# Patient Record
Sex: Female | Born: 1970 | Hispanic: No | Marital: Married | Smoking: Never smoker
Health system: Southern US, Community
[De-identification: ages and names within clinical notes are randomized; demographics above are authoritative.]

## PROBLEM LIST (undated history)

## (undated) DIAGNOSIS — D649 Anemia, unspecified: Secondary | ICD-10-CM

## (undated) HISTORY — DX: Anemia, unspecified: D64.9

---

## 1993-11-14 HISTORY — PX: TUBAL LIGATION: SHX77

## 2011-03-15 ENCOUNTER — Emergency Department: Payer: Self-pay | Admitting: Emergency Medicine

## 2012-08-06 IMAGING — US ABDOMEN ULTRASOUND
1 series · 14 of 25 positions shown · non-contrast
Comparison: none

REASON FOR EXAM: abdominal pain x 2 weeks with elevated liver panel
Flex 4
COMMENTS:

[Series 1: abdomen ultrasound · 0.31mm/px · 14 of 47 slices shown]
[im 1/47]
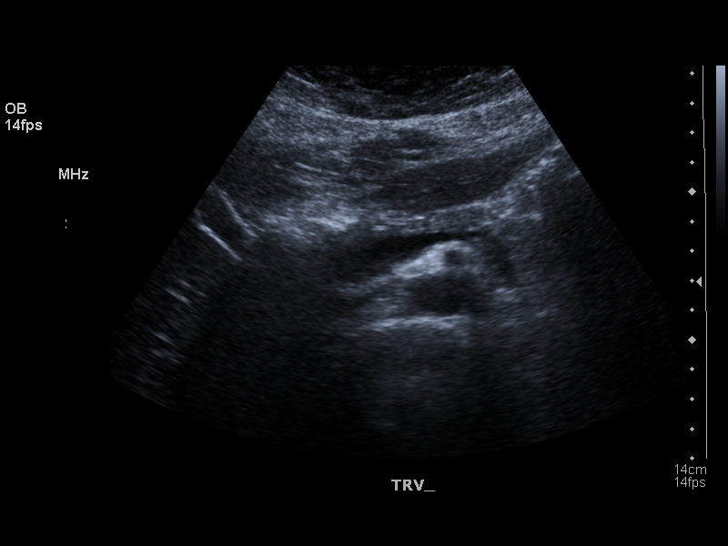
[im 4/47]
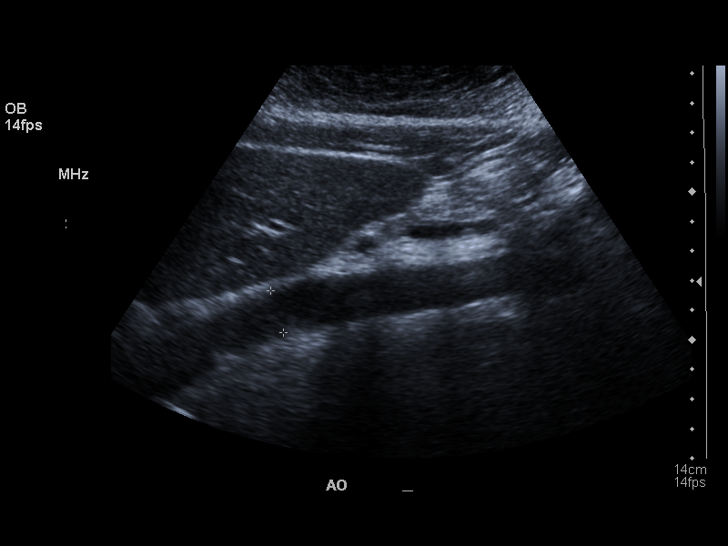
[im 8/47]
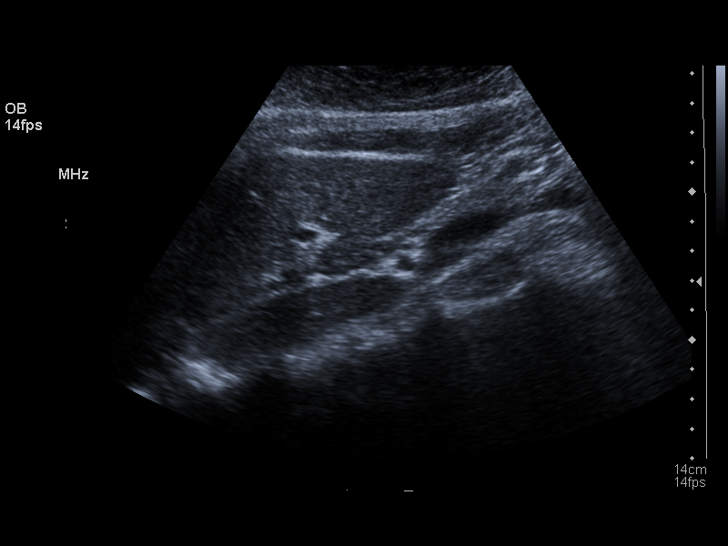
[im 12/47]
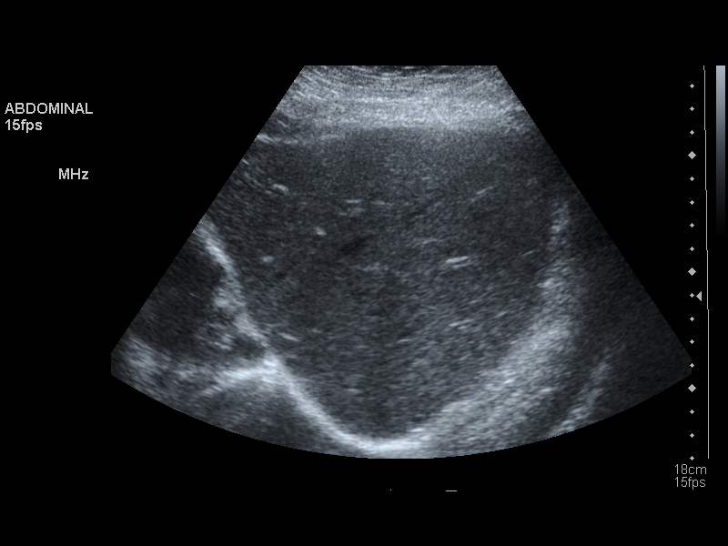
[im 16/47]
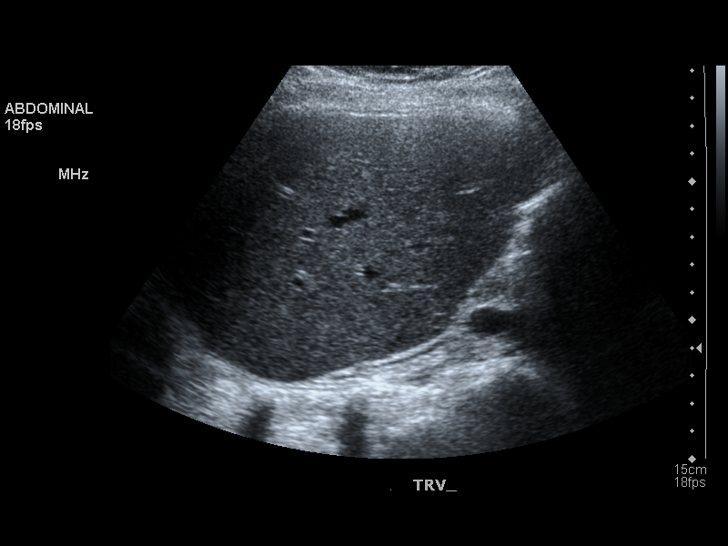
[im 18/47]
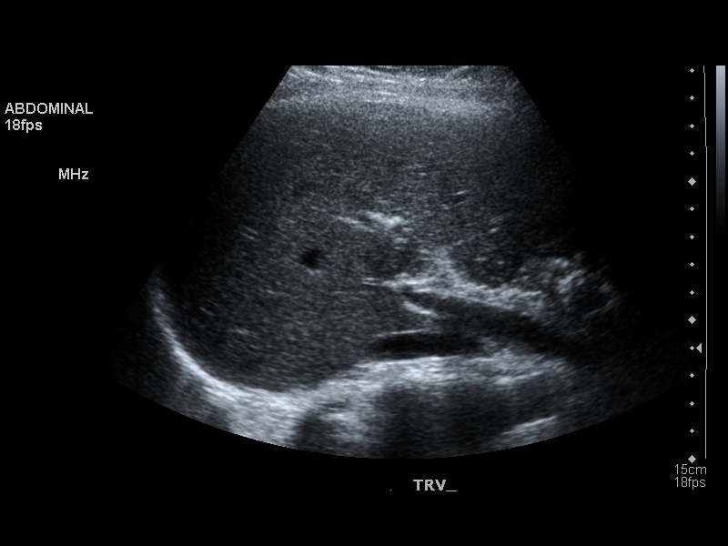
[im 22/47]
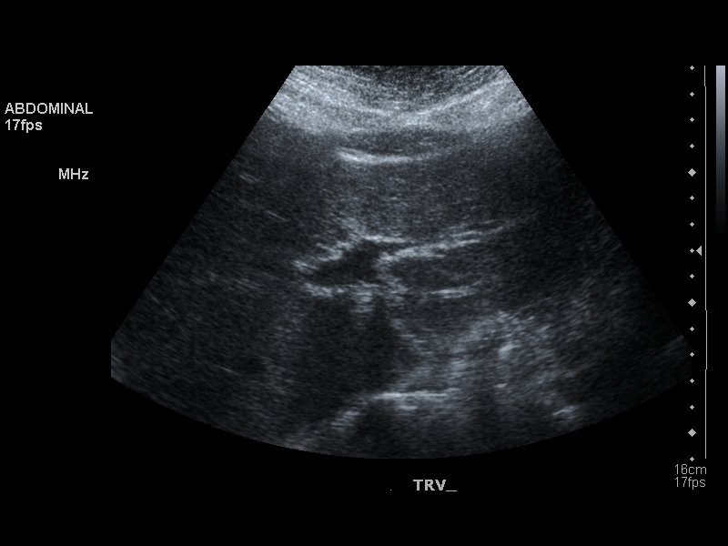
[im 25/47]
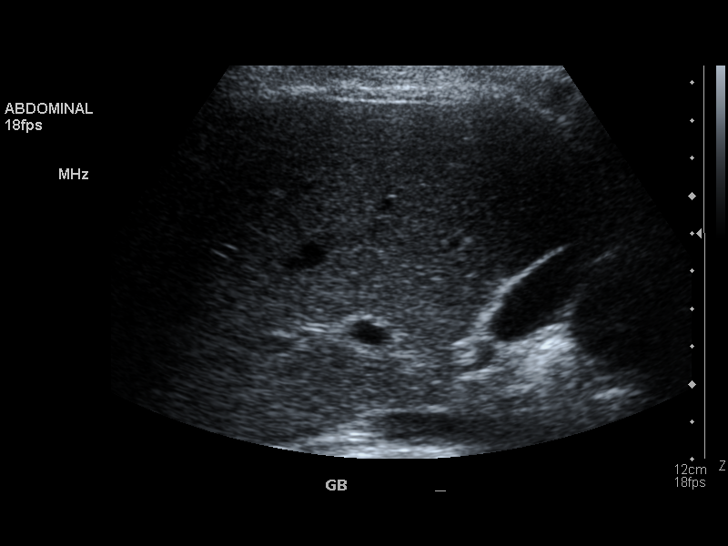
[im 29/47]
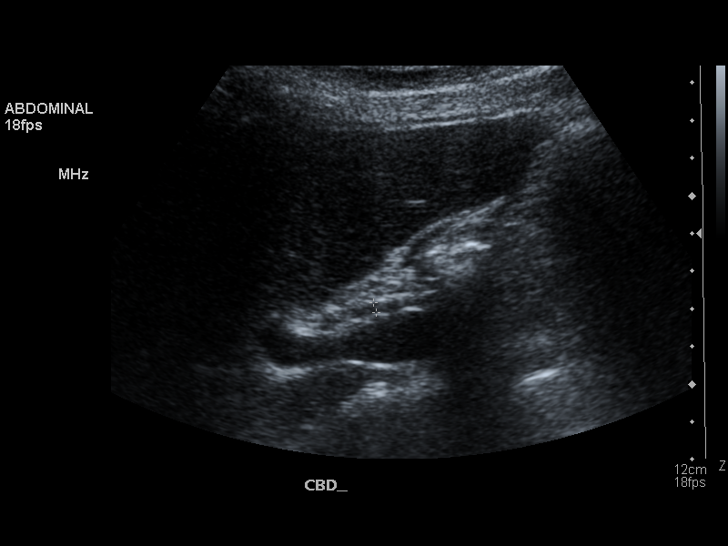
[im 31/47]
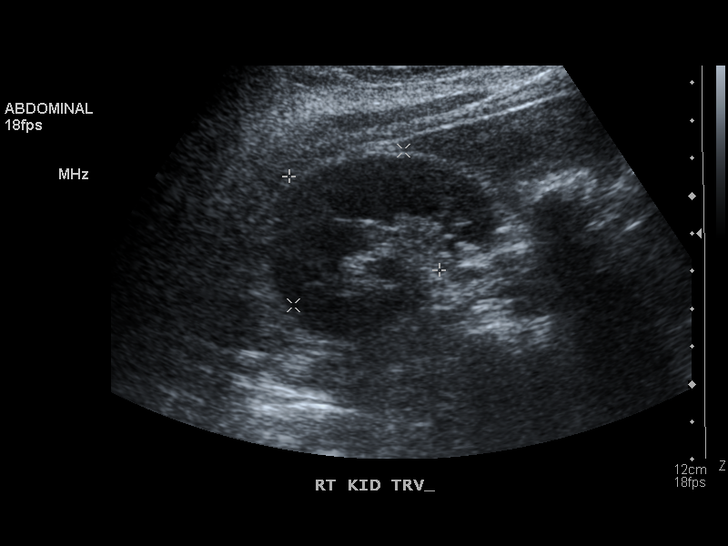
[im 35/47]
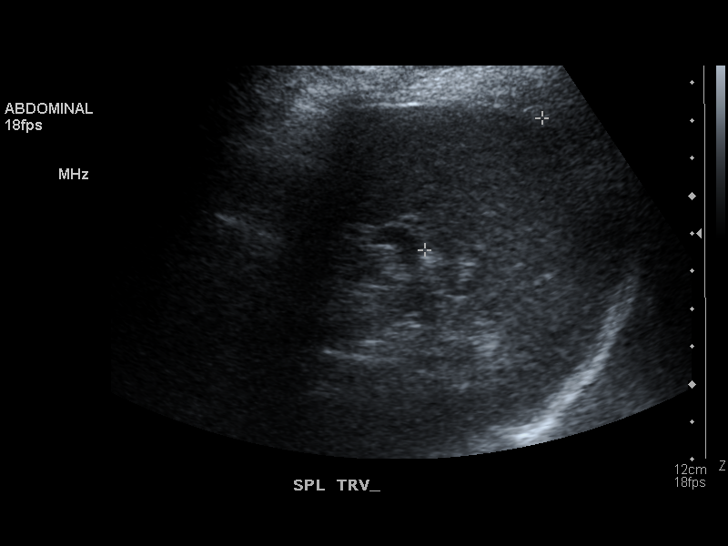
[im 39/47]
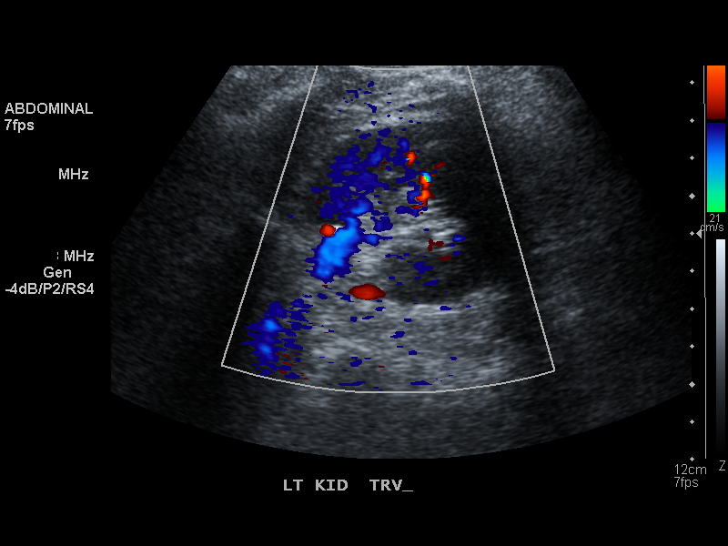
[im 43/47]
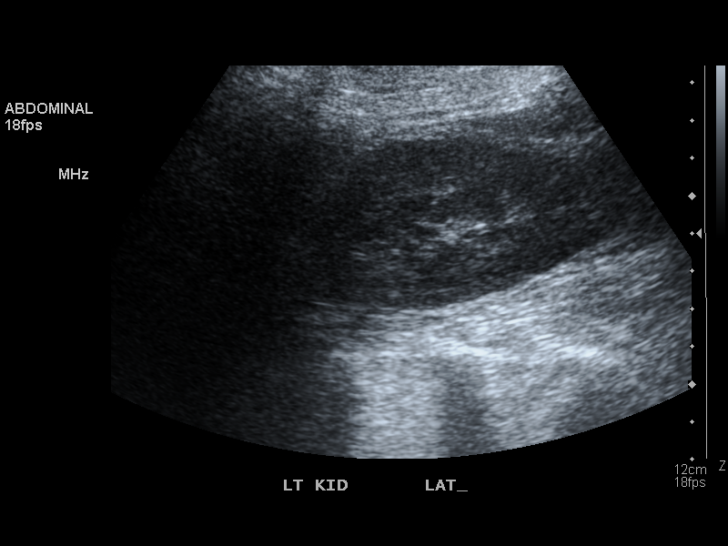
[im 47/47]
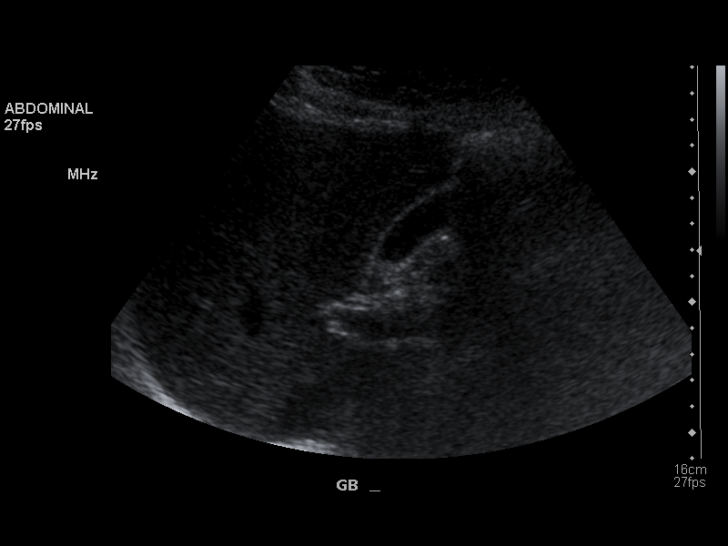

[14 of 25 positions shown; findings below may reference images not displayed]

PROCEDURE:     US  - US ABDOMEN GENERAL SURVEY  - March 15, 2011  [DATE]

RESULT:     Comparison: None.

Technique and Findings:
Multiple grayscale and color Doppler images were obtained of the abdomen.

The visualized liver pancreas, and spleen are unremarkable. Gallbladder is
normal. Sonographic Murphy's sign negative. Common bile duct measures 3 mm.
Images of the kidneys show no hydronephrosis.
IMPRESSION: No acute findings. Normal gallbladder.

## 2013-01-10 ENCOUNTER — Emergency Department: Payer: Self-pay | Admitting: Internal Medicine

## 2015-09-30 ENCOUNTER — Encounter: Payer: Self-pay | Admitting: Family Medicine

## 2015-09-30 ENCOUNTER — Ambulatory Visit (INDEPENDENT_AMBULATORY_CARE_PROVIDER_SITE_OTHER): Payer: BLUE CROSS/BLUE SHIELD | Admitting: Family Medicine

## 2015-09-30 ENCOUNTER — Ambulatory Visit
Admission: RE | Admit: 2015-09-30 | Discharge: 2015-09-30 | Disposition: A | Discharge status: Home / Self Care | Payer: BLUE CROSS/BLUE SHIELD | Source: Ambulatory Visit | Attending: Family Medicine | Admitting: Family Medicine

## 2015-09-30 VITALS — BP 128/76 | HR 84 | Temp 98.2°F | Resp 17 | Ht 63.0 in | Wt 185.9 lb

## 2015-09-30 DIAGNOSIS — R002 Palpitations: Secondary | ICD-10-CM | POA: Diagnosis not present

## 2015-09-30 DIAGNOSIS — R0600 Dyspnea, unspecified: Secondary | ICD-10-CM | POA: Insufficient documentation

## 2015-09-30 NOTE — Progress Notes (Signed)
Name: Karen Garza   MRN: [id number]    DOB: 1970-11-19   Date:09/30/2015       Progress Note  Subjective  Chief Complaint  Chief Complaint  Patient presents with  . Establish Care    NP    HPI  Pt. Is here to establish care. No previous PCP She presents for evaluation of a 1-2 year history of a faster heart rate, coupled with intermittent episodes of 'hard to breathe.' She describes heart beating faster than usual and she has to take a deep breath at that time. This happens even when she is sitting down and when she exerts herself. Working out or exercising makes her short of breath and she gets wheezing as well. She believes her heart was in irregular rhythm during a physical at her job recently.  She works as a Location manager.   Past Medical History  Diagnosis Date  . Anemia     Past Surgical History  Procedure Laterality Date  . Tubal ligation  1995    Family History  Problem Relation Age of Onset  . Diabetes Mother   . Thyroid disease Mother   . Hypertension Father   . Diabetes Father     Social History   Social History  . Marital Status: Married    Spouse Name: N/A  . Number of Children: N/A  . Years of Education: N/A   Occupational History  . Not on file.   Social History Main Topics  . Smoking status: Never Smoker   . Smokeless tobacco: Never Used  . Alcohol Use: No  . Drug Use: No  . Sexual Activity: No   Other Topics Concern  . Not on file   Social History Narrative  . No narrative on file    No current outpatient prescriptions on file.  Allergies  Allergen Reactions  . Codeine Palpitations    Review of Systems  Constitutional: Positive for chills (sometimes has chills). Negative for fever, weight loss and malaise/fatigue.  Respiratory: Positive for cough, shortness of breath and wheezing. Negative for sputum production.   Cardiovascular: Positive for chest pain and palpitations. Negative for leg swelling.  Gastrointestinal:  Positive for heartburn (occasional heartburn), nausea and vomiting.  Genitourinary: Negative for dysuria.  Neurological: Negative for headaches.  Psychiatric/Behavioral: Negative for depression. The patient is not nervous/anxious and does not have insomnia.     Objective  Filed Vitals:   09/30/15 0950  BP: 128/76  Pulse: 84  Temp: 98.2 F (36.8 C)  TempSrc: Oral  Resp: 17  Height:  (1.6 m)  Weight: 185 lb 14.4 oz (84.324 kg)  SpO2: 98%    Physical Exam  Constitutional: She is oriented to person, place, and time and well-developed, well-nourished, and in no distress.  HENT:  Head: Normocephalic and atraumatic.  Eyes: Conjunctivae are normal. Pupils are equal, round, and reactive to light.  Neck: Normal range of motion. Neck supple.  Cardiovascular: Normal rate, regular rhythm and normal heart sounds.   No murmur heard. Pulmonary/Chest: Effort normal and breath sounds normal. She has no wheezes. She has no rales.  Abdominal: Soft. Bowel sounds are normal. There is no tenderness.  Neurological: She is alert and oriented to person, place, and time.  Skin: Skin is warm and dry.  Psychiatric: Memory, affect and judgment normal.  Nursing note and vitals reviewed.   Assessment & Plan  1. Intermittent palpitations EKG reviewed which is unremarkable. We will obtain laboratory evaluation and chest x-ray for patient's complaints  of palpitations and dyspnea. Referral to cardiology for further workup. - EKG 12-Lead - DG Chest 2 View; Future - CBC with Differential - Comprehensive Metabolic Panel (CMET) - TSH - Magnesium - Ambulatory referral to Cardiology    [facility]yed Asad A. Faylene KurtzShah [facility] 09/30/2015 10:09 AM

## 2015-10-01 LAB — CBC WITH DIFFERENTIAL/PLATELET
BASOS ABS: 0 10*3/uL (ref 0.0–0.2)
Basos: 1 %
EOS (ABSOLUTE): 0.1 10*3/uL (ref 0.0–0.4)
Eos: 2 %
HEMATOCRIT: 33.2 % — AB (ref 34.0–46.6)
HEMOGLOBIN: 10.3 g/dL — AB (ref 11.1–15.9)
Immature Grans (Abs): 0 10*3/uL (ref 0.0–0.1)
Immature Granulocytes: 0 %
Lymphocytes Absolute: 2.2 10*3/uL (ref 0.7–3.1)
Lymphs: 38 %
MCH: 21.6 pg — ABNORMAL LOW (ref 26.6–33.0)
MCHC: 31 g/dL — AB (ref 31.5–35.7)
MCV: 70 fL — ABNORMAL LOW (ref 79–97)
MONOS ABS: 0.4 10*3/uL (ref 0.1–0.9)
Monocytes: 7 %
Neutrophils Absolute: 3.1 10*3/uL (ref 1.4–7.0)
Neutrophils: 52 %
Platelets: 378 10*3/uL (ref 150–379)
RBC: 4.77 x10E6/uL (ref 3.77–5.28)
RDW: 17.8 % — AB (ref 12.3–15.4)
WBC: 5.8 10*3/uL (ref 3.4–10.8)

## 2015-10-01 LAB — COMPREHENSIVE METABOLIC PANEL
ALBUMIN: 4.1 g/dL (ref 3.5–5.5)
ALT: 13 IU/L (ref 0–32)
AST: 14 IU/L (ref 0–40)
Albumin/Globulin Ratio: 1.4 (ref 1.1–2.5)
Alkaline Phosphatase: 69 IU/L (ref 39–117)
BILIRUBIN TOTAL: 0.4 mg/dL (ref 0.0–1.2)
BUN / CREAT RATIO: 17 (ref 9–23)
BUN: 11 mg/dL (ref 6–24)
CHLORIDE: 104 mmol/L (ref 97–106)
CO2: 25 mmol/L (ref 18–29)
Calcium: 9.1 mg/dL (ref 8.7–10.2)
Creatinine, Ser: 0.64 mg/dL (ref 0.57–1.00)
GFR calc Af Amer: 126 mL/min/{1.73_m2} (ref >59)
GFR calc non Af Amer: 109 mL/min/{1.73_m2} (ref >59)
GLOBULIN, TOTAL: 2.9 g/dL (ref 1.5–4.5)
GLUCOSE: 92 mg/dL (ref 65–99)
POTASSIUM: 4.7 mmol/L (ref 3.5–5.2)
SODIUM: 143 mmol/L (ref 136–144)
Total Protein: 7 g/dL (ref 6.0–8.5)

## 2015-10-01 LAB — MAGNESIUM: MAGNESIUM: 2 mg/dL (ref 1.6–2.3)

## 2015-10-01 LAB — TSH: TSH: 2.19 u[IU]/mL (ref 0.450–4.500)

## 2015-10-12 ENCOUNTER — Ambulatory Visit: Payer: BLUE CROSS/BLUE SHIELD | Admitting: Family Medicine

## 2015-10-15 ENCOUNTER — Ambulatory Visit: Payer: BLUE CROSS/BLUE SHIELD | Admitting: Family Medicine

## 2015-12-01 ENCOUNTER — Encounter: Payer: Self-pay | Admitting: *Deleted

## 2015-12-01 ENCOUNTER — Ambulatory Visit: Payer: BLUE CROSS/BLUE SHIELD | Admitting: Cardiovascular Disease

## 2016-04-19 ENCOUNTER — Encounter: Payer: Self-pay | Admitting: Family Medicine

## 2016-04-19 ENCOUNTER — Ambulatory Visit (INDEPENDENT_AMBULATORY_CARE_PROVIDER_SITE_OTHER): Payer: BLUE CROSS/BLUE SHIELD | Admitting: Family Medicine

## 2016-04-19 VITALS — BP 110/70 | HR 90 | Temp 98.8°F | Resp 16 | Ht 63.0 in | Wt 182.5 lb

## 2016-04-19 DIAGNOSIS — R42 Dizziness and giddiness: Secondary | ICD-10-CM

## 2016-04-19 DIAGNOSIS — Z0000 Encounter for general adult medical examination without abnormal findings: Secondary | ICD-10-CM | POA: Insufficient documentation

## 2016-04-19 MED ORDER — MECLIZINE HCL 25 MG PO TABS: 25.0000 mg | ORAL_TABLET | Freq: Two times a day (BID) | ORAL | Status: DC | PRN

## 2016-04-19 NOTE — Progress Notes (Signed)
Name: Karen Garza   MRN: [id number]    DOB: 01-08-1971   Date:04/19/2016       Progress Note  Subjective  Chief Complaint  Chief Complaint  Patient presents with  . Annual Exam    HPI  Pt. Is here for a Complete Physical Exam with Gynecological exams.  She does not wish to have a Gynecological exam and does not have a gynecologist.   Past Medical History  Diagnosis Date  . Anemia     Past Surgical History  Procedure Laterality Date  . Tubal ligation  1995    Family History  Problem Relation Age of Onset  . Diabetes Mother   . Thyroid disease Mother   . Hypertension Father   . Diabetes Father     Social History   Social History  . Marital Status: Married    Spouse Name: N/A  . Number of Children: N/A  . Years of Education: N/A   Occupational History  . Not on file.   Social History Main Topics  . Smoking status: Never Smoker   . Smokeless tobacco: Never Used  . Alcohol Use: No  . Drug Use: No  . Sexual Activity: No   Other Topics Concern  . Not on file   Social History Narrative    No current outpatient prescriptions on file.  Allergies  Allergen Reactions  . Codeine Palpitations     Review of Systems  Constitutional: Positive for chills (chills every day when she comes home from work.) and malaise/fatigue (feels tired every day because of work.). Negative for fever.  HENT: Negative for congestion and sore throat.   Eyes: Negative for blurred vision.  Respiratory: Positive for shortness of breath (occasional shortness of breath). Negative for cough.   Cardiovascular: Negative for chest pain.  Gastrointestinal: Negative for heartburn, nausea (1 episode of nausea and vomiting with Vertigo last week which was self limiting.), vomiting, abdominal pain, diarrhea, constipation and blood in stool.  Genitourinary: Negative for dysuria, frequency and hematuria.  Musculoskeletal: Negative for myalgias, back pain and joint pain.  Skin: Negative for  rash.  Neurological: Positive for dizziness (1 episode of vertigo last week, dizziness, felt room was spinning, unsteady on her feet, resolved spontaneously.). Negative for tremors, seizures and headaches.  Psychiatric/Behavioral: Negative for depression. The patient is not nervous/anxious.     Objective  Filed Vitals:   04/19/16 1122  BP: 110/70  Pulse: 90  Temp: 98.8 F (37.1 C)  TempSrc: Oral  Resp: 16  Height: 5\' 3"  (1.6 m)  Weight: 182 lb 8 oz (82.781 kg)  SpO2: 98%    Physical Exam  Constitutional: She is oriented to person, place, and time and well-developed, well-nourished, and in no distress.  HENT:  Head: Normocephalic and atraumatic.  Eyes: Pupils are equal, round, and reactive to light.  Cardiovascular: Normal rate, regular rhythm and normal heart sounds.   Pulmonary/Chest: Effort normal and breath sounds normal.  Abdominal: Soft. Bowel sounds are normal.  Musculoskeletal: Normal range of motion.  Neurological: She is alert and oriented to person, place, and time. She has normal strength, normal reflexes and intact cranial nerves.  Psychiatric: Mood, memory, affect and judgment normal.  Nursing note and vitals reviewed.     Assessment & Plan  1. Annual physical exam Age appropriate laboratory screenings completed. - CBC with Differential - Lipid Profile - Vitamin D (25 hydroxy)  2. Vertigo Patient's episode lasting appears to be related to vertigo. We will recommend trying Meclizine or  relief. Prescription provided - meclizine (ANTIVERT) 25 MG tablet; Take 1 tablet (25 mg total) by mouth 2 (two) times daily as needed for dizziness.  Dispense: 60 tablet; Refill: 0   Ripley Lovecchio Asad A. Faylene [facility] 04/19/2016 11:53 AM

## 2017-02-21 IMAGING — CR DG CHEST 2V
1 series · 2 of 2 positions shown · non-contrast
Comparison: None.

CLINICAL DATA: Intermittent palpitations, dyspnea, wheezing

EXAM:
CHEST  2 VIEW

[Series 1: dg chest 2 view · 0.14mm/px · 2 of 2 slices shown]
[im 1/2]
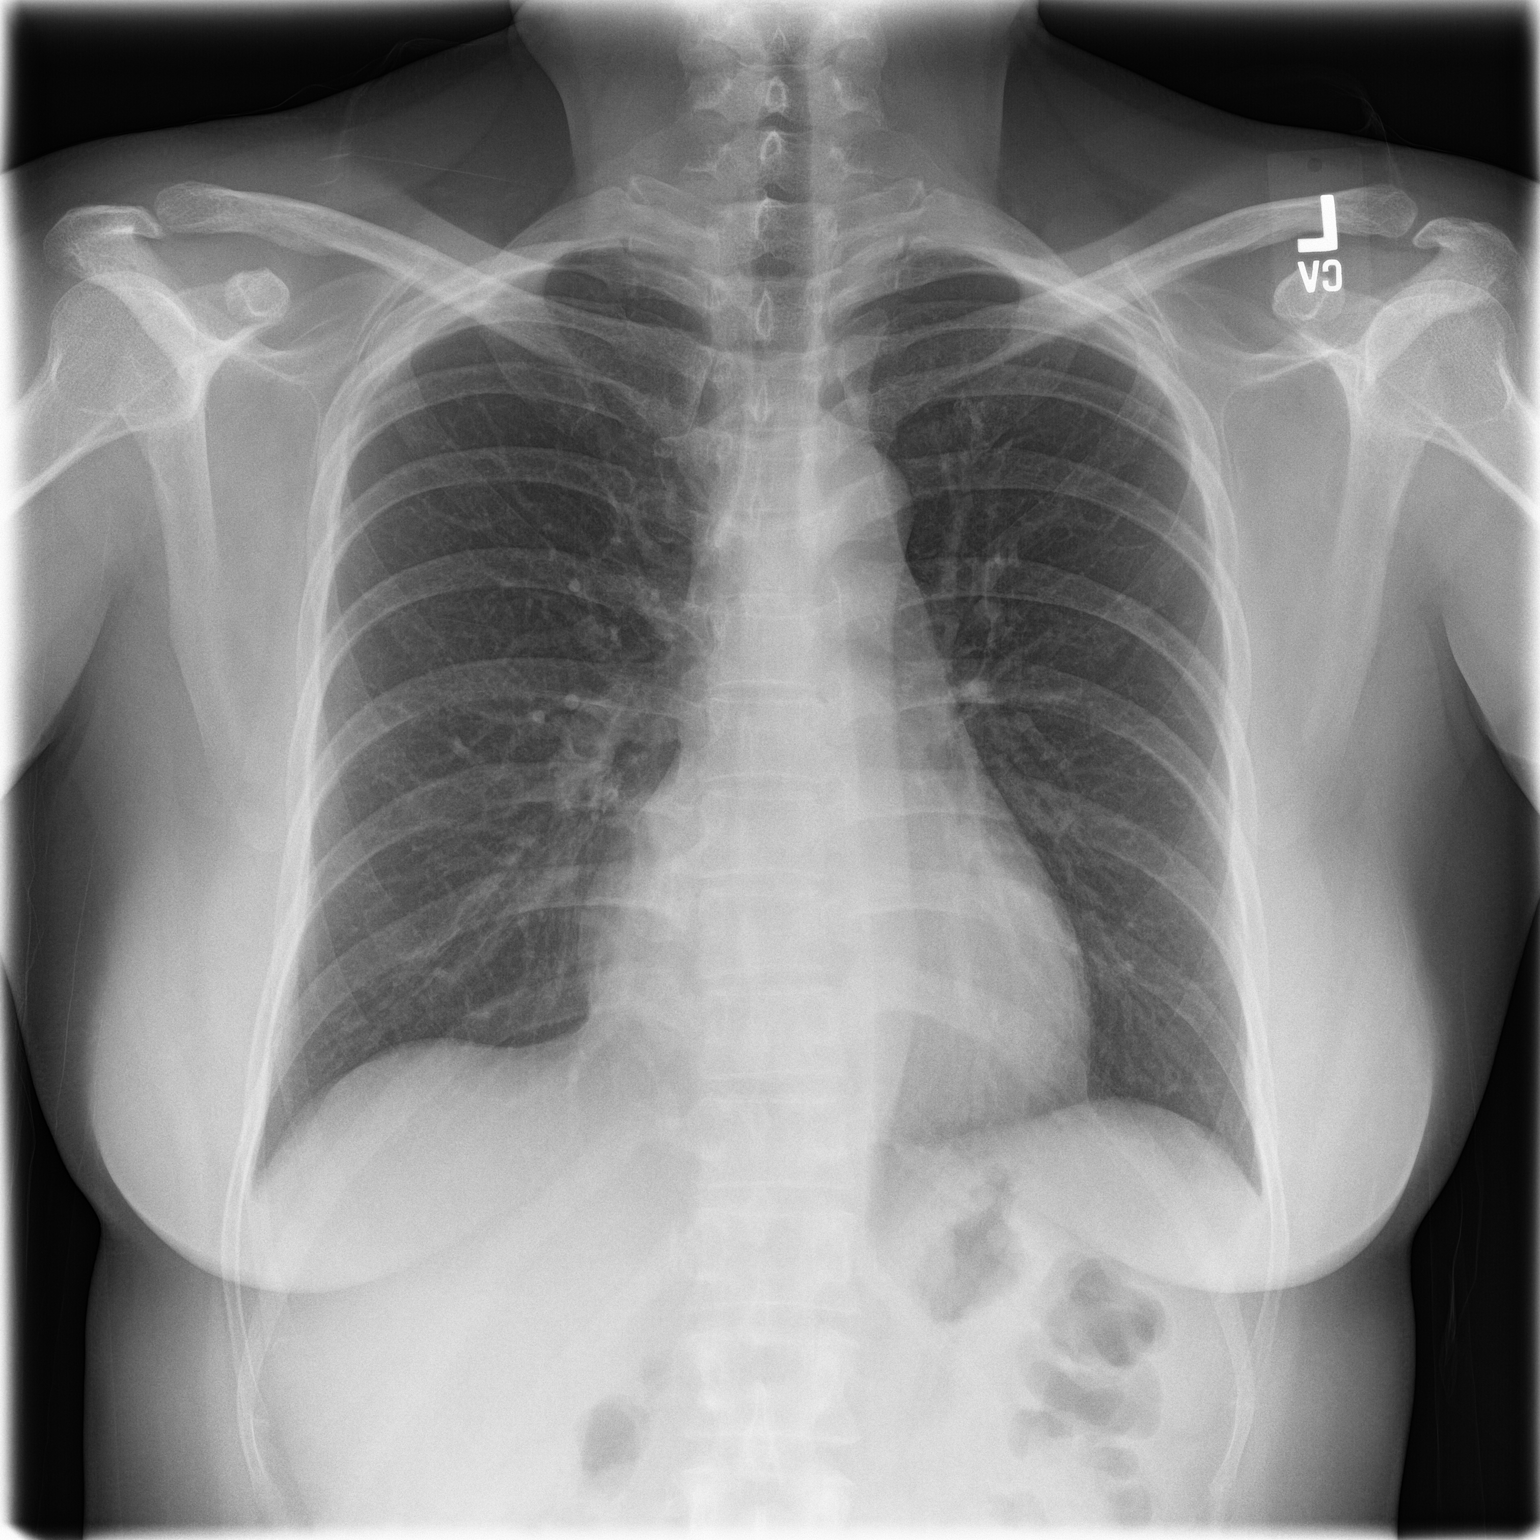
[im 2/2]
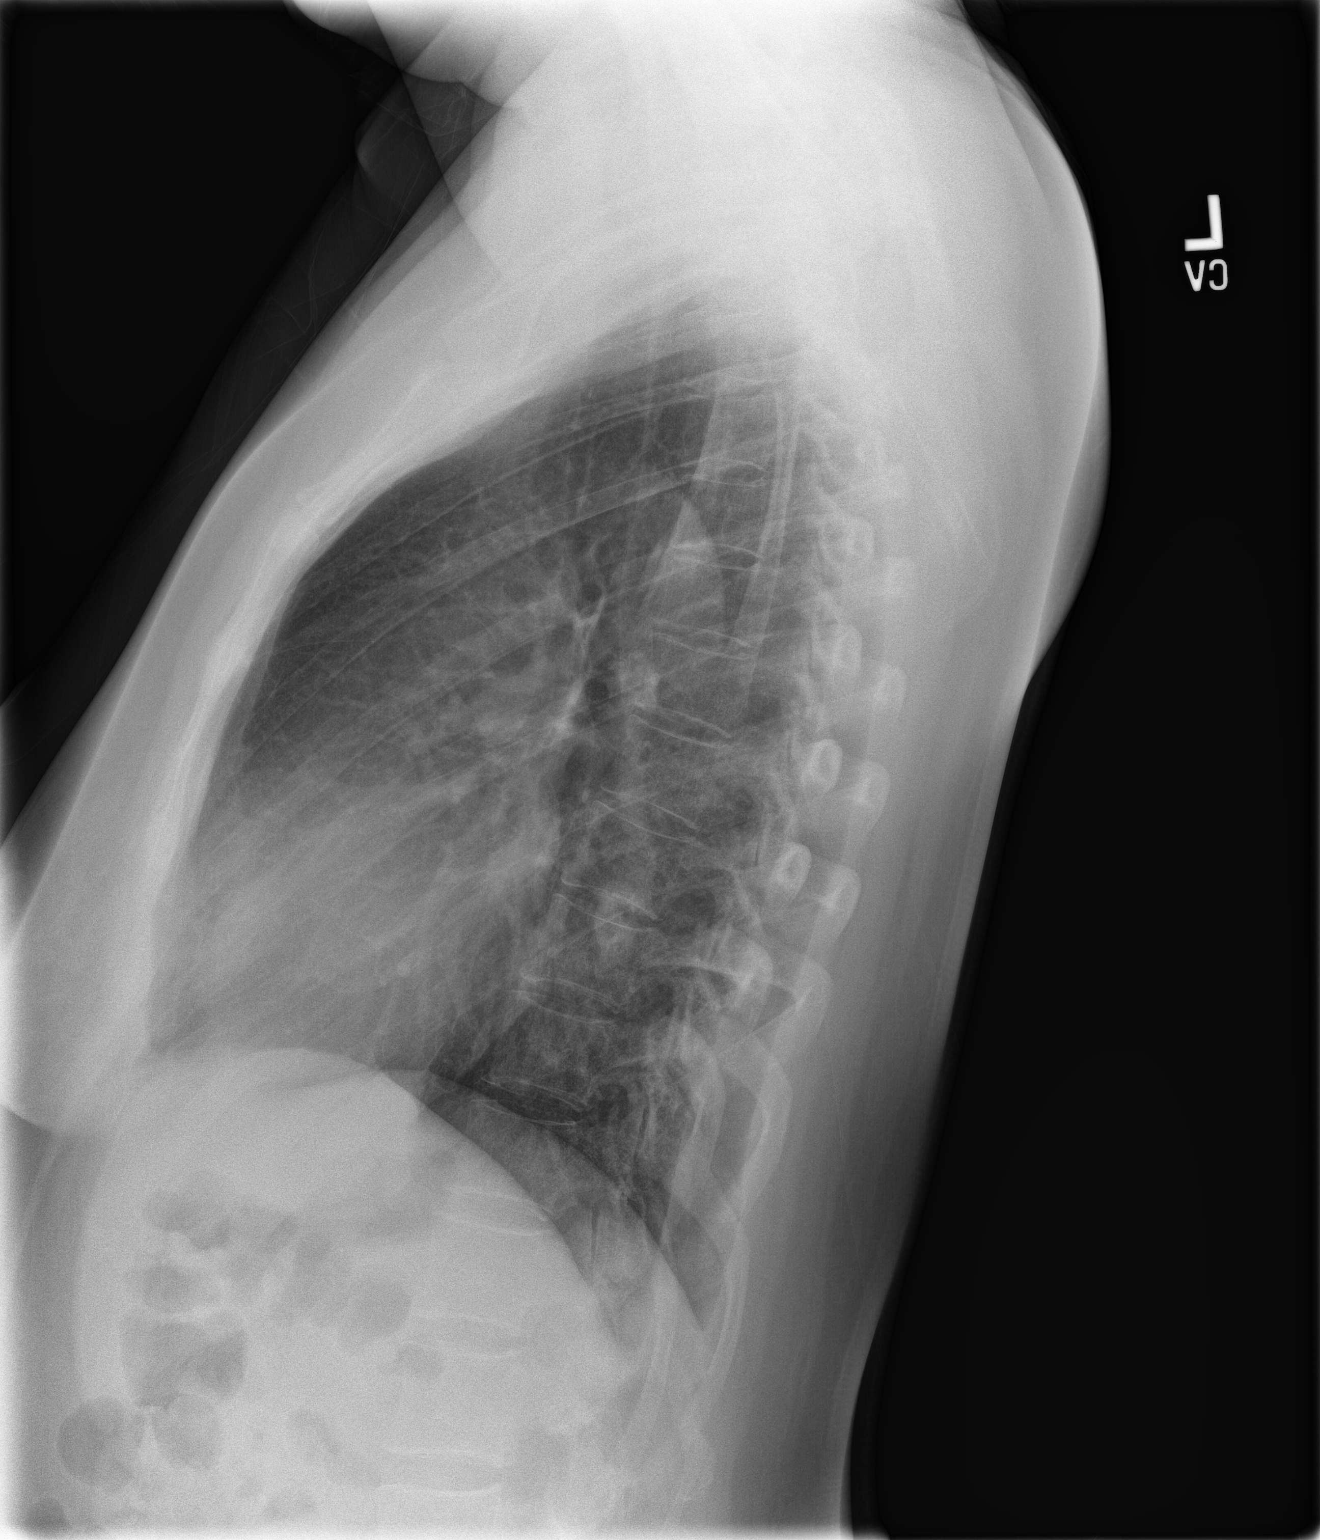

[2 of 2 positions shown; findings below may reference images not displayed]

FINDINGS: The heart size and mediastinal contours are within normal limits.
Both lungs are clear. The visualized skeletal structures are
unremarkable.
IMPRESSION: No active cardiopulmonary disease.

## 2019-07-23 ENCOUNTER — Other Ambulatory Visit: Payer: Self-pay

## 2019-07-23 ENCOUNTER — Emergency Department
Admission: EM | Admit: 2019-07-23 | Discharge: 2019-07-23 | Disposition: A | Discharge status: Home / Self Care | Payer: BC Managed Care – PPO | Attending: Student | Admitting: Student

## 2019-07-23 ENCOUNTER — Encounter: Payer: Self-pay | Admitting: Emergency Medicine

## 2019-07-23 DIAGNOSIS — Z20822 Contact with and (suspected) exposure to covid-19: Secondary | ICD-10-CM

## 2019-07-23 DIAGNOSIS — B349 Viral infection, unspecified: Secondary | ICD-10-CM

## 2019-07-23 DIAGNOSIS — J029 Acute pharyngitis, unspecified: Secondary | ICD-10-CM | POA: Diagnosis present

## 2019-07-23 DIAGNOSIS — U071 COVID-19: Secondary | ICD-10-CM | POA: Diagnosis not present

## 2019-07-23 LAB — SARS CORONAVIRUS 2 BY RT PCR (HOSPITAL ORDER, PERFORMED IN ~~LOC~~ HOSPITAL LAB): SARS Coronavirus 2: POSITIVE — AB

## 2019-07-23 MED ORDER — MECLIZINE HCL 25 MG PO TABS: 25.0000 mg | ORAL_TABLET | Freq: Three times a day (TID) | ORAL | 0 refills | Status: AC | PRN

## 2019-07-23 NOTE — Discharge Instructions (Signed)
The symptoms that she present today are suspicious for COVID along with the symptoms of your husband.  The COVID test was done and you should avoid going out of the house and to you have received the results of the test.  A note is written for you to remain out of work until these results have been received.  Also if the results are positive you will be remaining out of work for 10 days.

## 2019-07-23 NOTE — ED Provider Notes (Signed)
[facility] Emergency Department Provider Note  ____________________________________________   First MD Initiated Contact with Patient 07/23/19 1134     (approximate)  I have reviewed the triage vital signs and the nursing notes.   HISTORY  Chief Complaint Sore Throat, Dizziness, and Generalized Body Aches   HPI Karen Garza is a 48 y.o. female presents to the ED with her husband with complaint of sore throat, dizziness, loss of appetite and a bitter taste in her mouth for approximately 1 week.  She denies cough.  Her husband is also being seen for similar symptoms however he is coughing.  Patient has been unaware of fever but was febrile in the triage area.  Patient works in the same work area as her husband who does know that they were exposed to someone positive with COVID.  Patient denies any shortness of breath.     Past Medical History:  Diagnosis Date  . Anemia     Patient Active Problem List   Diagnosis Date Noted  . Annual physical exam 04/19/2016  . Vertigo 04/19/2016  . Intermittent palpitations 09/30/2015    Past Surgical History:  Procedure Laterality Date  . TUBAL LIGATION  1995    Prior to Admission medications   Medication Sig Start Date End Date Taking? Authorizing Provider  meclizine (ANTIVERT) 25 MG tablet Take 1 tablet (25 mg total) by mouth 2 (two) times daily as needed for dizziness. Patient not taking: Reported on 07/23/2019 04/19/16   Ellyn HackShah, Syed Asad A, MD    Allergies Codeine  Family History  Problem Relation Age of Onset  . Diabetes Mother   . Thyroid disease Mother   . Hypertension Father   . Diabetes Father     Social History Social History   Tobacco Use  . Smoking status: Never Smoker  . Smokeless tobacco: Never Used  Substance Use Topics  . Alcohol use: No    Alcohol/week: 0.0 standard drinks  . Drug use: No    Review of Systems Constitutional: No fever/chills Eyes: No visual changes. ENT:  Positive sore throat. Cardiovascular: Denies chest pain. Respiratory: Denies shortness of breath. Gastrointestinal: No abdominal pain.  No nausea, no vomiting.  Genitourinary: Negative for dysuria. Musculoskeletal: Negative for back pain. Skin: Negative for rash. Neurological: Negative for headaches, focal weakness or numbness. ____________________________________________   PHYSICAL EXAM:  VITAL SIGNS: ED Triage Vitals  Enc Vitals Group     BP 07/23/19 1105 135/80     Pulse Rate 07/23/19 1105 85     Resp --      Temp 07/23/19 1105 99.2 F (37.3 C)     Temp src --      SpO2 07/23/19 1105 99 %     Weight --      Height --      Head Circumference --      Peak Flow --      Pain Score 07/23/19 1100 0     Pain Loc --      Pain Edu? --      Excl. in GC? --    Constitutional: Alert and oriented. Well appearing and in no acute distress. Eyes: Conjunctivae are normal.  Head: Atraumatic. Nose: No congestion/rhinnorhea. Mouth/Throat: Mucous membranes are moist.  Oropharynx non-erythematous. Neck: No stridor.   Hematological/Lymphatic/Immunilogical: No cervical lymphadenopathy. Cardiovascular: Normal rate, regular rhythm. Grossly normal heart sounds.  Good peripheral circulation. Respiratory: Normal respiratory effort.  No retractions. Lungs CTAB. Musculoskeletal: Moves upper and lower extremities with any difficulty.  Normal gait was noted. Neurologic:  Normal speech and language. No gross focal neurologic deficits are appreciated. No gait instability. Skin:  Skin is warm, dry and intact. No rash noted. Psychiatric: Mood and affect are normal. Speech and behavior are normal.  ____________________________________________   LABS (all labs ordered are listed, but only abnormal results are displayed)  Labs Reviewed  SARS CORONAVIRUS 2 (HOSPITAL ORDER, [place] [facility])    PROCEDURES  Procedure(s) performed (including Critical Care):  Procedures    ____________________________________________   INITIAL IMPRESSION / ASSESSMENT AND PLAN / ED COURSE  As part of my medical decision making, I reviewed the following data within the electronic MEDICAL RECORD NUMBER Notes from prior ED visits and [place] Controlled Substance Database  Karen Garza was evaluated in Emergency Department on 07/23/2019 for the symptoms described in the history of present illness. She was evaluated in the context of the global COVID-19 pandemic, which necessitated consideration that the patient might be at risk for infection with the SARS-CoV-2 virus that causes COVID-19. Institutional protocols and algorithms that pertain to the evaluation of patients at risk for COVID-19 are in a state of rapid change based on information released by regulatory bodies including the CDC and federal and state organizations. These policies and algorithms were followed during the patient's care in the ED.  48 year old female presents to the ED with her husband with symptoms suspicious for COVID.  This patient states that she is not had a known COVID exposure but states that people where she works has reported they are positive.  She denies any difficulty breathing or shortness of breath.  She was unaware of any fever until told that she had a low-grade temp in triage.  COVID test was done and patient will remain at home until the results have been received.  ____________________________________________   FINAL CLINICAL IMPRESSION(S) / ED DIAGNOSES  Final diagnoses:  Viral illness  Suspected Covid-19 Virus Infection     ED Discharge Orders    None       Note:  This document was prepared using Dragon voice recognition software and may include unintentional dictation errors.    Johnn Hai, PA-C 07/23/19 1415    Lilia Pro., MD 07/23/19 [phone]

## 2019-07-23 NOTE — ED Notes (Signed)
Pt also c/o loss of taste and fatigue for a week now. Pt states no known exposure to COVID

## 2019-07-23 NOTE — ED Triage Notes (Signed)
soer throat, dizziness and loss of appetite and bitter taste n mouth.  Denies cough.

## 2019-07-24 NOTE — ED Provider Notes (Signed)
Patient made aware that her COVID test is positive.  Patient was giving her letter to be out of work to her supervisor and also spoke with the supervisor with permission from the patient.   Johnn Hai, PA-C 07/24/19 1017    Lilia Pro., MD 07/25/19 1620

## 2020-09-17 ENCOUNTER — Other Ambulatory Visit: Payer: Self-pay | Admitting: Infectious Diseases

## 2020-09-17 DIAGNOSIS — Z1231 Encounter for screening mammogram for malignant neoplasm of breast: Secondary | ICD-10-CM

## 2021-04-27 ENCOUNTER — Other Ambulatory Visit: Payer: Self-pay | Admitting: Infectious Diseases

## 2021-04-27 DIAGNOSIS — Z1231 Encounter for screening mammogram for malignant neoplasm of breast: Secondary | ICD-10-CM

## 2022-04-29 ENCOUNTER — Other Ambulatory Visit: Payer: Self-pay | Admitting: Infectious Diseases

## 2022-04-29 DIAGNOSIS — Z1231 Encounter for screening mammogram for malignant neoplasm of breast: Secondary | ICD-10-CM

## 2022-06-13 ENCOUNTER — Emergency Department: Payer: BC Managed Care – PPO

## 2022-06-13 ENCOUNTER — Other Ambulatory Visit: Payer: Self-pay

## 2022-06-13 ENCOUNTER — Emergency Department
Admission: EM | Admit: 2022-06-13 | Discharge: 2022-06-13 | Disposition: A | Discharge status: Home / Self Care | Payer: BC Managed Care – PPO | Attending: Emergency Medicine | Admitting: Emergency Medicine

## 2022-06-13 DIAGNOSIS — S41152A Open bite of left upper arm, initial encounter: Secondary | ICD-10-CM | POA: Diagnosis not present

## 2022-06-13 DIAGNOSIS — S00402A Unspecified superficial injury of left ear, initial encounter: Secondary | ICD-10-CM | POA: Diagnosis present

## 2022-06-13 DIAGNOSIS — S41151A Open bite of right upper arm, initial encounter: Secondary | ICD-10-CM | POA: Insufficient documentation

## 2022-06-13 DIAGNOSIS — S1191XA Laceration without foreign body of unspecified part of neck, initial encounter: Secondary | ICD-10-CM | POA: Insufficient documentation

## 2022-06-13 DIAGNOSIS — S0101XA Laceration without foreign body of scalp, initial encounter: Secondary | ICD-10-CM | POA: Insufficient documentation

## 2022-06-13 DIAGNOSIS — Z23 Encounter for immunization: Secondary | ICD-10-CM | POA: Diagnosis not present

## 2022-06-13 DIAGNOSIS — W540XXA Bitten by dog, initial encounter: Secondary | ICD-10-CM | POA: Diagnosis not present

## 2022-06-13 DIAGNOSIS — S01311A Laceration without foreign body of right ear, initial encounter: Secondary | ICD-10-CM

## 2022-06-13 DIAGNOSIS — S01312A Laceration without foreign body of left ear, initial encounter: Secondary | ICD-10-CM | POA: Diagnosis not present

## 2022-06-13 MED ORDER — TETANUS-DIPHTH-ACELL PERTUSSIS 5-2.5-18.5 LF-MCG/0.5 IM SUSY
0.5000 mL | PREFILLED_SYRINGE | Freq: Once | INTRAMUSCULAR | Status: AC
  Administered 2022-06-13: 0.5 mL via INTRAMUSCULAR
  Filled 2022-06-13: qty 0.5

## 2022-06-13 MED ORDER — AMOXICILLIN-POT CLAVULANATE 875-125 MG PO TABS: 1.0000 | ORAL_TABLET | Freq: Two times a day (BID) | ORAL | 0 refills | Status: AC

## 2022-06-13 MED ORDER — LIDOCAINE HCL (PF) 1 % IJ SOLN
INTRAMUSCULAR | Status: AC
  Filled 2022-06-13: qty 10

## 2022-06-13 MED ORDER — ONDANSETRON HCL 4 MG/2ML IJ SOLN
4.0000 mg | INTRAMUSCULAR | Status: AC
  Administered 2022-06-13: 4 mg via INTRAVENOUS
  Filled 2022-06-13: qty 2

## 2022-06-13 MED ORDER — LIDOCAINE-EPINEPHRINE 2 %-1:100000 IJ SOLN
20.0000 mL | Freq: Once | INTRAMUSCULAR | Status: AC
  Administered 2022-06-13: 20 mL via INTRADERMAL
  Filled 2022-06-13: qty 1

## 2022-06-13 MED ORDER — FENTANYL CITRATE PF 50 MCG/ML IJ SOSY
25.0000 ug | PREFILLED_SYRINGE | Freq: Once | INTRAMUSCULAR | Status: AC
  Administered 2022-06-13: 25 ug via INTRAVENOUS
  Filled 2022-06-13: qty 1

## 2022-06-13 NOTE — ED Triage Notes (Signed)
Pt attacked by neighbors dog after trying to give her a treat. Pt has puncture wounds left head, right ear lobe is tore, r wrist and r arm swelling with bruse. BLE puncture wounds. Bilateral breasts were bitten as well. Pt is not up to date on tetanus shot. PT is alert and oriented, states dog is up to date on shots.

## 2022-06-13 NOTE — ED Provider Triage Note (Signed)
See my other note (MSE)   [name], MD 06/13/22 [phone]

## 2022-06-13 NOTE — ED Provider Notes (Signed)
Emergency Medicine Provider Triage Evaluation Note  Karen Garza , a 51 y.o. female  was evaluated in triage.  Pt complains of being attacked by dog.   She was bit and has pain on her right hand and wrist, slight on her right elbow, also on the top of the head.  Last tetanus shot was about 10 years ago.  She would like something for pain reporting moderate to severe pain in the right hand.  Review of Systems  Positive: Pain bruising feels swollen in the right hand also across the top of her scalp and her right ear Negative:   Physical Exam  There were no vitals taken for this visit. Gen:   Awake, no distress   Resp:  Normal effort  MSK:   Moves extremities without difficulty except right hand which is bandaged, patient holds it Other:  Injury to the right earlobe  Medical Decision Making  Medically screening exam initiated at 2:54 PM.  Appropriate orders placed.  Karen Garza was informed that the remainder of the evaluation will be completed by another provider, this initial triage assessment does not replace that evaluation, and the importance of remaining in the ED until their evaluation is complete.     Karen Creamer, MD 06/13/22 1455

## 2022-06-13 NOTE — ED Notes (Signed)
MD continues to be at the bedside suturing pt. Tolerating well.

## 2022-06-17 ENCOUNTER — Encounter: Payer: Self-pay | Admitting: Emergency Medicine

## 2022-06-17 ENCOUNTER — Other Ambulatory Visit: Payer: Self-pay

## 2022-06-17 ENCOUNTER — Emergency Department: Payer: BC Managed Care – PPO

## 2022-06-17 ENCOUNTER — Emergency Department
Admission: EM | Admit: 2022-06-17 | Discharge: 2022-06-17 | Disposition: A | Discharge status: Other Acute Inpatient Hospital | Payer: BC Managed Care – PPO | Attending: Student in an Organized Health Care Education/Training Program | Admitting: Student in an Organized Health Care Education/Training Program

## 2022-06-17 DIAGNOSIS — L089 Local infection of the skin and subcutaneous tissue, unspecified: Secondary | ICD-10-CM

## 2022-06-17 DIAGNOSIS — W540XXA Bitten by dog, initial encounter: Secondary | ICD-10-CM | POA: Diagnosis not present

## 2022-06-17 DIAGNOSIS — S61451A Open bite of right hand, initial encounter: Secondary | ICD-10-CM | POA: Insufficient documentation

## 2022-06-17 LAB — CBC WITH DIFFERENTIAL/PLATELET
Abs Immature Granulocytes: 0.02 10*3/uL (ref 0.00–0.07)
Basophils Absolute: 0 10*3/uL (ref 0.0–0.1)
Basophils Relative: 1 %
Eosinophils Absolute: 0.1 10*3/uL (ref 0.0–0.5)
Eosinophils Relative: 1 %
HCT: 35.9 % — ABNORMAL LOW (ref 36.0–46.0)
Hemoglobin: 11.2 g/dL — ABNORMAL LOW (ref 12.0–15.0)
Immature Granulocytes: 0 %
Lymphocytes Relative: 18 %
Lymphs Abs: 1.1 10*3/uL (ref 0.7–4.0)
MCH: 24.3 pg — ABNORMAL LOW (ref 26.0–34.0)
MCHC: 31.2 g/dL (ref 30.0–36.0)
MCV: 77.9 fL — ABNORMAL LOW (ref 80.0–100.0)
Monocytes Absolute: 0.3 10*3/uL (ref 0.1–1.0)
Monocytes Relative: 5 %
Neutro Abs: 4.7 10*3/uL (ref 1.7–7.7)
Neutrophils Relative %: 75 %
Platelets: 284 10*3/uL (ref 150–400)
RBC: 4.61 MIL/uL (ref 3.87–5.11)
RDW: 14.6 % (ref 11.5–15.5)
WBC: 6.2 10*3/uL (ref 4.0–10.5)
nRBC: 0 % (ref 0.0–0.2)

## 2022-06-17 LAB — BASIC METABOLIC PANEL
Anion gap: 10 (ref 5–15)
BUN: 15 mg/dL (ref 6–20)
CO2: 24 mmol/L (ref 22–32)
Calcium: 8.7 mg/dL — ABNORMAL LOW (ref 8.9–10.3)
Chloride: 107 mmol/L (ref 98–111)
Creatinine, Ser: 0.6 mg/dL (ref 0.44–1.00)
GFR, Estimated: >60 mL/min (ref >60)
Glucose, Bld: 160 mg/dL — ABNORMAL HIGH (ref 70–99)
Potassium: 3.7 mmol/L (ref 3.5–5.1)
Sodium: 141 mmol/L (ref 135–145)

## 2022-06-17 LAB — SEDIMENTATION RATE: Sed Rate: 52 mm/hr — ABNORMAL HIGH (ref 0–30)

## 2022-06-17 MED ORDER — VANCOMYCIN HCL 1750 MG/350ML IV SOLN
1750.0000 mg | Freq: Once | INTRAVENOUS | Status: AC
  Administered 2022-06-17: 1750 mg via INTRAVENOUS
  Filled 2022-06-17: qty 350

## 2022-06-17 MED ORDER — SODIUM CHLORIDE 0.9 % IV SOLN
3.0000 g | Freq: Once | INTRAVENOUS | Status: AC
  Administered 2022-06-17: 3 g via INTRAVENOUS
  Filled 2022-06-17: qty 8

## 2022-06-17 NOTE — ED Notes (Signed)
[facility] TRANSFER CALLED PER DR ROBINSON, IMAGES POWER SHARE .Marland Kitchen

## 2022-06-17 NOTE — Consult Note (Signed)
PHARMACY -  BRIEF ANTIBIOTIC NOTE   Pharmacy has received consult(s) for Vancomycin from an ED provider.  The patient's profile has been reviewed for ht/wt/allergies/indication/available labs.    One time order(s) placed for Vancomycin 1750 mg x 1   Further antibiotics/pharmacy consults should be ordered by admitting physician if indicated.                       Thank you, Kassandra Meriweather Rodriguez-Guzman PharmD, BCPS 06/17/2022 2:05 PM

## 2022-06-17 NOTE — ED Notes (Signed)
Vanc stopped due to patient having itching episode. Patient states she feels itchy with some redness on left arm. MD notified and ordered rate to be changed to lower rate. As antibiotic was restarted patient started to feel the same itch. Patient removed from drip as she states she feels better when she Is not on it at all. MD notified. No further orders

## 2022-06-17 NOTE — ED Provider Notes (Signed)
[facility] Provider Note    Event Date/Time   First MD Initiated Contact with Patient 06/17/22 1330     (approximate)   History   Arm Swelling (Dog bite/)   HPI  Karen Garza is a 51 y.o. female recent dog bite to the right hand presents to the ER from [place] clinic after being evaluated in follow-up with worsening right hand swelling pain and drainage.  Patient sent to the ER for admission for IV antibiotics and Ortho consultation.  Patient was compliant with her antibiotic Augmentin.  States it is having worsening pain particular right index finger.     Physical Exam   Triage Vital Signs: ED Triage Vitals  Enc Vitals Group     BP 06/17/22 1152 135/63     Pulse Rate 06/17/22 1152 94     Resp 06/17/22 1152 16     Temp 06/17/22 1152 98.3 F (36.8 C)     Temp Source 06/17/22 1152 Oral     SpO2 06/17/22 1152 100 %     Weight --      Height --      Head Circumference --      Peak Flow --      Pain Score 06/17/22 1157 9     Pain Loc --      Pain Edu? --      Excl. in GC? --     Most recent vital signs: Vitals:   06/17/22 1152 06/17/22 1500  BP: 135/63 129/73  Pulse: 94 73  Resp: 16 16  Temp: 98.3 F (36.8 C)   SpO2: 100% 100%     Constitutional: Alert  Eyes: Conjunctivae are normal.  Head: Atraumatic. Nose: No congestion/rhinnorhea. Mouth/Throat: Mucous membranes are moist.   Neck: Painless ROM.  Cardiovascular:   Good peripheral circulation. Respiratory: Normal respiratory effort.  No retractions.  Gastrointestinal: Soft and nontender.  Musculoskeletal: Moderate to severe amount of swelling and ecchymosis to the right forearm and hand.  Does have serous drainage from several of the wounds.  With overlying warmth.  She has fusiform swelling of the right index finger with pain on palpation along the flexor tendon sheath.  Pain is worsened with extension.  It is held in flexion. Neurologic:  MAE spontaneously. No gross focal  neurologic deficits are appreciated.  Skin:  Skin is warm, dry and intact. No rash noted. Psychiatric: Mood and affect are normal. Speech and behavior are normal.    ED Results / Procedures / Treatments   Labs (all labs ordered are listed, but only abnormal results are displayed) Labs Reviewed  CBC WITH DIFFERENTIAL/PLATELET - Abnormal; Notable for the following components:      Result Value   Hemoglobin 11.2 (*)    HCT 35.9 (*)    MCV 77.9 (*)    MCH 24.3 (*)    All other components within normal limits  BASIC METABOLIC PANEL - Abnormal; Notable for the following components:   Glucose, Bld 160 (*)    Calcium 8.7 (*)    All other components within normal limits  SEDIMENTATION RATE - Abnormal; Notable for the following components:   Sed Rate 52 (*)    All other components within normal limits     EKG     RADIOLOGY Please see ED Course for my review and interpretation.  I personally reviewed all radiographic images ordered to evaluate for the above acute complaints and reviewed radiology reports and findings.  These findings were personally discussed  with the patient.  Please see medical record for radiology report.    PROCEDURES:  Critical Care performed: No  Procedures   MEDICATIONS ORDERED IN ED: Medications  vancomycin (VANCOREADY) IVPB 1750 mg/350 mL (has no administration in time range)  Ampicillin-Sulbactam (UNASYN) 3 g in sodium chloride 0.9 % 100 mL IVPB (3 g Intravenous New Bag/Given 06/17/22 1429)     IMPRESSION / MDM / ASSESSMENT AND PLAN / ED COURSE  I reviewed the triage vital signs and the nursing notes.                              Differential diagnosis includes, but is not limited to, colitis, abscess, flexor tenosynovitis, foreign body, necrotizing soft tissue infection  Patient presented to the ER for evaluation of symptoms as described above.  This presenting complaint could reflect a potentially life-threatening illness therefore the  patient will be placed on continuous pulse oximetry and telemetry for monitoring.  Laboratory evaluation will be sent to evaluate for the above complaints.   X-ray ordered to evaluate for retained foreign body shows On my review interpretation.  No significant leukocytosis does not meet septic criteria but exam is concerning for oxygen she is infection.  Will consult Ortho   Clinical Course as of 06/17/22 1513  Fri Jun 17, 2022  1354 Given patient's physical exam findings concerning for flexor tendon sheath the infection we will order broad-spectrum antibiotics.  I have consulted with orthopedics, Dr. Okey Dupre who is currently in the OR and is recommending transfer for hand consultation.  Patient has requested transfer to [facility] [PR]  1513 Case discussed with Dr. Chase Picket of Ortho [facility] Bane has recommended ER to ER transfer for Ortho consultation.  Patient receiving IV antibiotics.  She is stable and appropriate for transfer.  Is clinically the physician at [facility], accepted patient for evaluation. [PR]    Clinical Course User Index [PR] Willy Eddy, MD    FINAL CLINICAL IMPRESSION(S) / ED DIAGNOSES   Final diagnoses:  Animal bite of right hand with infection, initial encounter     Rx / DC Orders   ED Discharge Orders     None        Note:  This document was prepared using Dragon voice recognition software and may include unintentional dictation errors.    Willy Eddy, MD 06/17/22 [phone]

## 2022-06-17 NOTE — ED Notes (Signed)
[facility]  CALLED   FOR  TRANSPORT  TO  [facility]  ED

## 2022-06-17 NOTE — ED Notes (Signed)
First Nurse Note: Patient to ED sent by Dr Sampson Goon for dog bite. MD states patient will need admission for IV antibiotics and an ortho consult for possible surgery.

## 2022-06-17 NOTE — ED Notes (Signed)
Called [facility] (Kim) and [facility] transport (Katrina) to cancel transport.  Pt transported by [facility]

## 2022-06-17 NOTE — ED Triage Notes (Signed)
Patient was sent over by [facility] for further evaluation of right arm wound. Patient was bite by dog 7/31 and had follow up appt to have stitches removed today but sent for possible IV antibiotics and otho consult.

## 2022-06-20 NOTE — ED Provider Notes (Signed)
[facility] Provider Note   Event Date/Time   First MD Initiated Contact with Patient 06/13/22 1459     (approximate) History  Animal Bite  HPI Frimy Uffelman is a 51 y.o. female who was allegedly attacked by a dog to bilateral arms, the left neck, and scalp.  Patient is complaining of severe pain over her right hand.  States that this is a known animal to her neighbor who is up-to-date on all vaccinations.  Patient states that she is up-to-date on her tetanus vaccine.  Patient states that she did not lose consciousness.  Patient denies any pulsatile bleeding from any injuries. ROS: Patient currently denies any vision changes, tinnitus, difficulty speaking, facial droop, sore throat, chest pain, shortness of breath, abdominal pain, nausea/vomiting/diarrhea, dysuria, or weakness/numbness/paresthesias in any extremity   Physical Exam  Triage Vital Signs: ED Triage Vitals  Enc Vitals Group     BP 06/13/22 1459 127/78     Pulse Rate 06/13/22 1459 78     Resp 06/13/22 1459 18     Temp 06/13/22 1459 98.4 F (36.9 C)     Temp Source 06/13/22 1459 Oral     SpO2 06/13/22 1459 100 %     Weight 06/13/22 1500 172 lb (78 kg)     Height 06/13/22 1500 5\' 5"  (1.651 m)     Head Circumference --      Peak Flow --      Pain Score 06/13/22 1500 7     Pain Loc --      Pain Edu? --      Excl. in GC? --    Most recent vital signs: Vitals:   06/13/22 1630 06/13/22 1700  BP: 120/84 133/86  Pulse: 78 88  Resp: 13 16  Temp:    SpO2: 100% 100%   General: Awake, oriented x4. CV:  Good peripheral perfusion.  Resp:  Normal effort.  Abd:  No distention.  Other:  Middle-aged Asian female sitting in bed in moderate distress secondary to pain with numerous puncture and laceration wounds over bilateral arms, left neck, scalp ED Results / Procedures / Treatments  Labs (all labs ordered are listed, but only abnormal results are displayed) Labs Reviewed - No data to  display RADIOLOGY ED MD interpretation: X-ray of the right hand and forearm shows no evidence of foreign bodies and no evidence of fracture or dislocation.  These images were interpreted by me -Agree with radiology assessment Official radiology report(s): No results found. PROCEDURES: Critical Care performed: No ..Laceration Repair  Date/Time: 06/20/2022 7:35 AM  Performed by: 08/20/2022, MD Authorized by: Merwyn Katos, MD   Consent:    Consent obtained:  Emergent situation   Consent given by:  Patient   Risks, benefits, and alternatives were discussed: yes     Risks discussed:  Infection and pain   Alternatives discussed:  No treatment and observation Universal protocol:    Immediately prior to procedure, a time out was called: yes     Patient identity confirmed:  Verbally with patient Anesthesia:    Anesthesia method:  Local infiltration   Local anesthetic:  Lidocaine 1% WITH epi Laceration details:    Location:  Hand   Hand location:  R hand, dorsum   Length (cm):  3   Depth (mm):  10 Pre-procedure details:    Preparation:  Patient was prepped and draped in usual sterile fashion and imaging obtained to evaluate for foreign bodies Exploration:  Limited defect created (wound extended): no     Hemostasis achieved with:  Direct pressure   Imaging obtained: x-ray     Imaging outcome: foreign body not noted     Wound exploration: wound explored through full range of motion and entire depth of wound visualized     Contaminated: no   Treatment:    Area cleansed with:  Povidone-iodine and saline   Amount of cleaning:  Extensive   Irrigation solution:  Sterile water   Irrigation volume:  200   Irrigation method:  Pressure wash   Visualized foreign bodies/material removed: no     Debridement:  None   Undermining:  None   Scar revision: no   Skin repair:    Repair method:  Sutures   Suture size:  4-0   Suture material:  Nylon   Suture technique:  Simple  interrupted   Number of sutures:  5 Approximation:    Approximation:  Loose Repair type:    Repair type:  Simple Post-procedure details:    Dressing:  Antibiotic ointment and non-adherent dressing   Procedure completion:  Tolerated well, no immediate complications .Marland Kitchen.Laceration Repair  Date/Time: 06/20/2022 7:38 AM  Performed by: Merwyn KatosBradler, Shealeigh Dunstan K, MD Authorized by: Merwyn KatosBradler, Davarion Cuffee K, MD   Consent:    Consent obtained:  Verbal and emergent situation   Consent given by:  Patient   Risks discussed:  Pain and infection Universal protocol:    Immediately prior to procedure, a time out was called: yes     Patient identity confirmed:  Verbally with patient Anesthesia:    Anesthesia method:  Local infiltration   Local anesthetic:  Lidocaine 1% WITH epi Laceration details:    Location:  Shoulder/arm   Shoulder/arm location:  R lower arm   Length (cm):  3   Depth (mm):  8 Pre-procedure details:    Preparation:  Patient was prepped and draped in usual sterile fashion and imaging obtained to evaluate for foreign bodies Exploration:    Limited defect created (wound extended): no     Hemostasis achieved with:  Direct pressure   Imaging obtained: x-ray     Imaging outcome: foreign body not noted     Wound exploration: wound explored through full range of motion and entire depth of wound visualized     Contaminated: no   Treatment:    Area cleansed with:  Povidone-iodine and saline   Amount of cleaning:  Standard   Irrigation solution:  Sterile water   Irrigation volume:  300   Irrigation method:  Syringe and pressure wash   Visualized foreign bodies/material removed: no   Skin repair:    Repair method:  Sutures   Suture size:  4-0   Suture material:  Nylon   Suture technique:  Simple interrupted   Number of sutures:  4 Approximation:    Approximation:  Loose Repair type:    Repair type:  Simple Post-procedure details:    Dressing:  Antibiotic ointment and non-adherent dressing    Procedure completion:  Tolerated well, no immediate complications .Marland Kitchen.Laceration Repair  Date/Time: 06/20/2022 7:43 AM  Performed by: Merwyn KatosBradler, Aulden Calise K, MD Authorized by: Merwyn KatosBradler, Kayia Billinger K, MD   Consent:    Consent obtained:  Emergent situation   Consent given by:  Patient   Risks, benefits, and alternatives were discussed: yes     Risks discussed:  Infection and poor wound healing   Alternatives discussed:  No treatment Universal protocol:    Immediately prior to procedure,  a time out was called: yes     Patient identity confirmed:  Verbally with patient Anesthesia:    Anesthesia method:  Local infiltration   Local anesthetic:  Lidocaine 1% WITH epi Laceration details:    Location:  Shoulder/arm   Shoulder/arm location:  R lower arm   Length (cm):  3   Depth (mm):  10 Pre-procedure details:    Preparation:  Imaging obtained to evaluate for foreign bodies and patient was prepped and draped in usual sterile fashion Exploration:    Limited defect created (wound extended): no     Hemostasis achieved with:  Direct pressure   Imaging obtained: x-ray     Imaging outcome: foreign body not noted     Wound exploration: wound explored through full range of motion and entire depth of wound visualized   Treatment:    Area cleansed with:  Povidone-iodine and saline   Amount of cleaning:  Standard   Irrigation solution:  Sterile water   Irrigation volume:  200   Irrigation method:  Syringe   Visualized foreign bodies/material removed: no   Skin repair:    Repair method:  Sutures   Suture size:  4-0   Suture material:  Nylon   Suture technique:  Simple interrupted   Number of sutures:  3 Approximation:    Approximation:  Loose Repair type:    Repair type:  Simple Post-procedure details:    Dressing:  Antibiotic ointment and non-adherent dressing   Procedure completion:  Tolerated well, no immediate complications .Marland KitchenLaceration Repair  Date/Time: 06/20/2022 8:53 AM  Performed by: Merwyn Katos, MD Authorized by: Merwyn Katos, MD   Consent:    Consent obtained:  Verbal and emergent situation   Consent given by:  Patient   Risks, benefits, and alternatives were discussed: yes     Risks discussed:  Infection, pain and poor wound healing   Alternatives discussed:  No treatment Universal protocol:    Immediately prior to procedure, a time out was called: yes     Patient identity confirmed:  Verbally with patient Anesthesia:    Anesthesia method:  Local infiltration   Local anesthetic:  Lidocaine 1% WITH epi Laceration details:    Location:  Neck   Neck location:  L posterior   Length (cm):  4   Depth (mm):  5 Pre-procedure details:    Preparation:  Patient was prepped and draped in usual sterile fashion Exploration:    Limited defect created (wound extended): no     Hemostasis achieved with:  Direct pressure   Imaging outcome: foreign body not noted     Contaminated: no   Treatment:    Area cleansed with:  Povidone-iodine and saline   Amount of cleaning:  Standard   Irrigation solution:  Sterile water   Irrigation volume:  400   Irrigation method:  Syringe   Visualized foreign bodies/material removed: no   Skin repair:    Repair method:  Steri-Strips   Number of Steri-Strips:  3 Approximation:    Approximation:  Loose Repair type:    Repair type:  Simple Post-procedure details:    Dressing:  Antibiotic ointment and non-adherent dressing   Procedure completion:  Tolerated well, no immediate complications .Marland KitchenLaceration Repair  Date/Time: 06/20/2022 10:00 AM  Performed by: Merwyn Katos, MD Authorized by: Merwyn Katos, MD   Consent:    Consent obtained:  Verbal   Consent given by:  Patient   Risks discussed:  Infection and poor  wound healing   Alternatives discussed:  No treatment Universal protocol:    Immediately prior to procedure, a time out was called: yes     Patient identity confirmed:  Verbally with patient Anesthesia:    Anesthesia  method:  None Laceration details:    Location:  Scalp   Scalp location:  L temporal   Length (cm):  5   Depth (mm):  10 Pre-procedure details:    Preparation:  Patient was prepped and draped in usual sterile fashion Exploration:    Limited defect created (wound extended): no     Contaminated: no   Treatment:    Area cleansed with:  Povidone-iodine and saline   Amount of cleaning:  Standard   Irrigation solution:  Sterile saline   Irrigation volume:  400   Irrigation method:  Syringe   Visualized foreign bodies/material removed: no   Skin repair:    Repair method:  Staples   Number of staples:  5 Approximation:    Approximation:  Close Repair type:    Repair type:  Simple Post-procedure details:    Dressing:  Antibiotic ointment and non-adherent dressing   Procedure completion:  Tolerated well, no immediate complications  MEDICATIONS ORDERED IN ED: Medications  fentaNYL (SUBLIMAZE) injection 25 mcg (25 mcg Intravenous Given 06/13/22 1529)  Tdap (BOOSTRIX) injection 0.5 mL (0.5 mLs Intramuscular Given 06/13/22 1757)  ondansetron (ZOFRAN) injection 4 mg (4 mg Intravenous Given 06/13/22 1527)  lidocaine-EPINEPHrine (XYLOCAINE W/EPI) 2 %-1:100000 (with pres) injection 20 mL (20 mLs Intradermal Given 06/13/22 1542)  lidocaine (PF) (XYLOCAINE) 1 % injection (  Given 06/13/22 1712)   IMPRESSION / MDM / ASSESSMENT AND PLAN / ED COURSE  I reviewed the triage vital signs and the nursing notes.                             Differential diagnosis includes, but is not limited to, vascular injury, neurologic injury, hand fracture, dislocation The patient is on the cardiac monitor to evaluate for evidence of arrhythmia and/or significant heart rate changes. Patient's presentation is most consistent with acute presentation with potential threat to life or bodily function. Patient is a 51 year old Asian female who presents after alleged dog bites to bilateral arms, left neck, and scalp.  All  lacerations hemostatic at this time.  Lacerations repaired with combination of suture and Steri-Strips.  As these were dog bites, they were irrigated copiously with a mixture of sterile water and iodine.  Larger lacerations over the hand were repaired at bedside after adequate cleaning.  X-ray showed no evidence of retained foreign bodies.  Given patient's multiple puncture wounds and lacerations as well as history of dog bite, will place patient on Augmentin for antibiotic prophylaxis.  Patient was offered stronger pain management including oxycodone or tramadol however she refused stating that she was ready to go back to work and did not want to be inebriated.  Patient was given strict recurrent return precautions regarding pain, signs of infection, or range of motion abnormalities in this hand.  Patient was informed that she would need to return for staple removal on her scalp.  All questions were answered prior to discharge.  Dispo: Discharge home with PCP follow-up   FINAL CLINICAL IMPRESSION(S) / ED DIAGNOSES   Final diagnoses:  Dog bite, initial encounter  Laceration of right ear lobe, initial encounter   Rx / DC Orders   ED Discharge Orders  Ordered    amoxicillin-clavulanate (AUGMENTIN) 875-125 MG tablet  2 times daily        06/13/22 1740           Note:  This document was prepared using Dragon voice recognition software and may include unintentional dictation errors.   Merwyn Katos, MD 06/20/22 1006
